# Patient Record
Sex: Female | Born: 1965 | Hispanic: Refuse to answer | Marital: Married
Health system: Southern US, Community
[De-identification: ages and names within clinical notes are randomized; demographics above are authoritative.]

## PROBLEM LIST (undated history)

## (undated) DIAGNOSIS — A630 Anogenital (venereal) warts: Secondary | ICD-10-CM

## (undated) DIAGNOSIS — I10 Essential (primary) hypertension: Secondary | ICD-10-CM

## (undated) DIAGNOSIS — R51 Headache: Secondary | ICD-10-CM

## (undated) DIAGNOSIS — Z8679 Personal history of other diseases of the circulatory system: Secondary | ICD-10-CM

## (undated) DIAGNOSIS — Z9189 Other specified personal risk factors, not elsewhere classified: Secondary | ICD-10-CM

## (undated) DIAGNOSIS — F329 Major depressive disorder, single episode, unspecified: Secondary | ICD-10-CM

## (undated) DIAGNOSIS — Z87448 Personal history of other diseases of urinary system: Secondary | ICD-10-CM

## (undated) HISTORY — PX: AUGMENTATION MAMMAPLASTY: SUR837

## (undated) HISTORY — DX: Personal history of other diseases of the circulatory system: Z86.79

## (undated) HISTORY — DX: Essential (primary) hypertension: I10

## (undated) HISTORY — DX: Anogenital (venereal) warts: A63.0

## (undated) HISTORY — PX: OTHER SURGICAL HISTORY: SHX169

## (undated) HISTORY — DX: Major depressive disorder, single episode, unspecified: F32.9

## (undated) HISTORY — DX: Other specified personal risk factors, not elsewhere classified: Z91.89

## (undated) HISTORY — DX: Headache: R51

## (undated) HISTORY — DX: Personal history of other diseases of urinary system: Z87.448

---

## 1999-09-14 ENCOUNTER — Other Ambulatory Visit: Admission: RE | Admit: 1999-09-14 | Discharge: 1999-09-14 | Payer: Self-pay | Admitting: Internal Medicine

## 1999-09-16 ENCOUNTER — Encounter: Admission: RE | Admit: 1999-09-16 | Discharge: 1999-12-15 | Payer: Self-pay | Admitting: Gynecology

## 2000-03-02 ENCOUNTER — Encounter: Payer: Self-pay | Admitting: Gynecology

## 2000-03-02 ENCOUNTER — Observation Stay (HOSPITAL_COMMUNITY): Admission: AD | Admit: 2000-03-02 | Discharge: 2000-03-03 | Payer: Self-pay | Admitting: Gynecology

## 2000-03-04 ENCOUNTER — Inpatient Hospital Stay (HOSPITAL_COMMUNITY): Admission: AD | Admit: 2000-03-04 | Discharge: 2000-03-06 | Payer: Self-pay | Admitting: Gynecology

## 2000-03-08 ENCOUNTER — Inpatient Hospital Stay (HOSPITAL_COMMUNITY): Admission: AD | Admit: 2000-03-08 | Discharge: 2000-03-12 | Payer: Self-pay | Admitting: Gynecology

## 2000-03-10 ENCOUNTER — Encounter: Payer: Self-pay | Admitting: Gynecology

## 2000-03-27 ENCOUNTER — Inpatient Hospital Stay (HOSPITAL_COMMUNITY): Admission: AD | Admit: 2000-03-27 | Discharge: 2000-03-27 | Payer: Self-pay | Admitting: Gynecology

## 2000-03-29 ENCOUNTER — Inpatient Hospital Stay (HOSPITAL_COMMUNITY): Admission: AD | Admit: 2000-03-29 | Discharge: 2000-03-29 | Payer: Self-pay | Admitting: Internal Medicine

## 2000-04-23 ENCOUNTER — Inpatient Hospital Stay (HOSPITAL_COMMUNITY): Admission: AD | Admit: 2000-04-23 | Discharge: 2000-04-25 | Payer: Self-pay | Admitting: *Deleted

## 2000-04-26 ENCOUNTER — Encounter: Admission: RE | Admit: 2000-04-26 | Discharge: 2000-07-19 | Payer: Self-pay | Admitting: *Deleted

## 2001-06-15 ENCOUNTER — Other Ambulatory Visit: Admission: RE | Admit: 2001-06-15 | Discharge: 2001-06-15 | Payer: Self-pay | Admitting: Gynecology

## 2003-07-23 ENCOUNTER — Other Ambulatory Visit: Admission: RE | Admit: 2003-07-23 | Discharge: 2003-07-23 | Payer: Self-pay | Admitting: Gynecology

## 2004-07-01 ENCOUNTER — Inpatient Hospital Stay: Admission: AD | Admit: 2004-07-01 | Discharge: 2004-07-01 | Payer: Self-pay | Admitting: Obstetrics and Gynecology

## 2004-07-25 ENCOUNTER — Inpatient Hospital Stay (HOSPITAL_COMMUNITY): Admission: AD | Admit: 2004-07-25 | Discharge: 2004-07-25 | Payer: Self-pay | Admitting: Gynecology

## 2004-08-01 ENCOUNTER — Inpatient Hospital Stay (HOSPITAL_COMMUNITY): Admission: AD | Admit: 2004-08-01 | Discharge: 2004-08-01 | Payer: Self-pay | Admitting: Gynecology

## 2004-08-11 ENCOUNTER — Encounter (INDEPENDENT_AMBULATORY_CARE_PROVIDER_SITE_OTHER): Payer: Self-pay | Admitting: Specialist

## 2004-08-11 ENCOUNTER — Ambulatory Visit (HOSPITAL_COMMUNITY): Admission: RE | Admit: 2004-08-11 | Discharge: 2004-08-11 | Payer: Self-pay | Admitting: Gynecology

## 2004-08-11 ENCOUNTER — Ambulatory Visit (HOSPITAL_BASED_OUTPATIENT_CLINIC_OR_DEPARTMENT_OTHER): Admission: RE | Admit: 2004-08-11 | Discharge: 2004-08-11 | Payer: Self-pay | Admitting: Gynecology

## 2004-09-23 ENCOUNTER — Ambulatory Visit: Payer: Self-pay | Admitting: Internal Medicine

## 2004-11-25 ENCOUNTER — Other Ambulatory Visit: Admission: RE | Admit: 2004-11-25 | Discharge: 2004-11-25 | Payer: Self-pay | Admitting: Obstetrics and Gynecology

## 2005-02-14 ENCOUNTER — Inpatient Hospital Stay (HOSPITAL_COMMUNITY): Admission: AD | Admit: 2005-02-14 | Discharge: 2005-02-14 | Payer: Self-pay | Admitting: Obstetrics and Gynecology

## 2005-07-24 ENCOUNTER — Inpatient Hospital Stay (HOSPITAL_COMMUNITY): Admission: AD | Admit: 2005-07-24 | Discharge: 2005-07-27 | Payer: Self-pay | Admitting: Obstetrics & Gynecology

## 2005-08-13 ENCOUNTER — Encounter: Admission: RE | Admit: 2005-08-13 | Discharge: 2005-09-12 | Payer: Self-pay | Admitting: Obstetrics & Gynecology

## 2005-09-13 ENCOUNTER — Encounter: Admission: RE | Admit: 2005-09-13 | Discharge: 2005-10-13 | Payer: Self-pay | Admitting: Obstetrics & Gynecology

## 2005-10-14 ENCOUNTER — Encounter: Admission: RE | Admit: 2005-10-14 | Discharge: 2005-11-10 | Payer: Self-pay | Admitting: Obstetrics & Gynecology

## 2005-11-11 ENCOUNTER — Encounter: Admission: RE | Admit: 2005-11-11 | Discharge: 2005-12-11 | Payer: Self-pay | Admitting: Obstetrics & Gynecology

## 2005-12-12 ENCOUNTER — Encounter: Admission: RE | Admit: 2005-12-12 | Discharge: 2006-01-03 | Payer: Self-pay | Admitting: Obstetrics & Gynecology

## 2009-06-06 LAB — CONVERTED CEMR LAB

## 2009-06-06 LAB — HM MAMMOGRAPHY

## 2010-06-02 ENCOUNTER — Ambulatory Visit: Payer: Self-pay | Admitting: Internal Medicine

## 2010-06-02 DIAGNOSIS — R51 Headache: Secondary | ICD-10-CM

## 2010-06-02 DIAGNOSIS — Z8679 Personal history of other diseases of the circulatory system: Secondary | ICD-10-CM | POA: Insufficient documentation

## 2010-06-02 DIAGNOSIS — Z87448 Personal history of other diseases of urinary system: Secondary | ICD-10-CM | POA: Insufficient documentation

## 2010-06-02 DIAGNOSIS — F3289 Other specified depressive episodes: Secondary | ICD-10-CM

## 2010-06-02 DIAGNOSIS — I10 Essential (primary) hypertension: Secondary | ICD-10-CM

## 2010-06-02 DIAGNOSIS — A630 Anogenital (venereal) warts: Secondary | ICD-10-CM | POA: Insufficient documentation

## 2010-06-02 DIAGNOSIS — Z9189 Other specified personal risk factors, not elsewhere classified: Secondary | ICD-10-CM | POA: Insufficient documentation

## 2010-06-02 DIAGNOSIS — F329 Major depressive disorder, single episode, unspecified: Secondary | ICD-10-CM

## 2010-06-02 DIAGNOSIS — R519 Headache, unspecified: Secondary | ICD-10-CM | POA: Insufficient documentation

## 2010-06-02 HISTORY — DX: Anogenital (venereal) warts: A63.0

## 2010-06-02 HISTORY — DX: Personal history of other diseases of the circulatory system: Z86.79

## 2010-06-02 HISTORY — DX: Personal history of other diseases of urinary system: Z87.448

## 2010-06-02 HISTORY — DX: Headache: R51

## 2010-06-02 HISTORY — DX: Major depressive disorder, single episode, unspecified: F32.9

## 2010-06-02 HISTORY — DX: Other specified personal risk factors, not elsewhere classified: Z91.89

## 2010-06-02 HISTORY — DX: Essential (primary) hypertension: I10

## 2010-06-02 HISTORY — DX: Other specified depressive episodes: F32.89

## 2010-06-03 LAB — CONVERTED CEMR LAB
AST: 16 units/L (ref 0–37)
Albumin: 4.5 g/dL (ref 3.5–5.2)
Alkaline Phosphatase: 68 units/L (ref 39–117)
BUN: 28 mg/dL — ABNORMAL HIGH (ref 6–23)
Basophils Absolute: 0.1 10*3/uL (ref 0.0–0.1)
Basophils Relative: 1.1 % (ref 0.0–3.0)
Bilirubin, Direct: 0.1 mg/dL (ref 0.0–0.3)
Chloride: 105 meq/L (ref 96–112)
Creatinine, Ser: 0.9 mg/dL (ref 0.4–1.2)
Glucose, Bld: 92 mg/dL (ref 70–99)
Hemoglobin: 14.3 g/dL (ref 12.0–15.0)
Lymphocytes Relative: 24.2 % (ref 12.0–46.0)
Monocytes Relative: 8.7 % (ref 3.0–12.0)
Neutro Abs: 4.4 10*3/uL (ref 1.4–7.7)
Neutrophils Relative %: 64.8 % (ref 43.0–77.0)
RBC: 4.53 M/uL (ref 3.87–5.11)
Total Protein: 7.4 g/dL (ref 6.0–8.3)
WBC: 6.8 10*3/uL (ref 4.5–10.5)

## 2010-06-19 ENCOUNTER — Encounter: Payer: Self-pay | Admitting: Internal Medicine

## 2010-09-26 ENCOUNTER — Encounter: Payer: Self-pay | Admitting: Gynecology

## 2010-10-06 NOTE — Assessment & Plan Note (Signed)
Summary: new pt/uhc/#/lb   Vital Signs:  Patient profile:   45 year old female Height:      67 inches (170.18 cm) Weight:      137.0 pounds (62.27 kg) BMI:     21.53 O2 Sat:      98 % on Room air Temp:     98.0 degrees F (36.67 degrees C) oral Pulse rate:   102 / minute BP sitting:   110 / 80  (left arm) Cuff size:   large  Vitals Entered By: Orlan Leavens RMA (June 02, 2010 3:04 PM)  O2 Flow:  Room air CC: New patient Is Patient Diabetic? No Pain Assessment Patient in pain? no        Primary Care Provider:  Newt Lukes MD  CC:  New patient.  History of Present Illness: new pt to me and our practice, here to est care  c/o depression  precipitated by ongoing separation and pending divorce onset >7mo ago but getting worse in past 12 weeks +hx same - never on meds but ongoing counseling >12 mo a/w tearfulness and feelings of worthlessness/hopelessness; "no energy to get up and go" insomnia and irritability of mood with coworkers and kids denies SI/HI no weight loss - no abd pain,  weakness or bowel changes  Preventive Screening-Counseling & Management  Alcohol-Tobacco     Alcohol drinks/day: <1     Alcohol Counseling: not indicated; use of alcohol is not excessive or problematic     Smoking Status: never     Tobacco Counseling: not indicated; no tobacco use  Caffeine-Diet-Exercise     Does Patient Exercise: yes     Exercise Counseling: not indicated; exercise is adequate     Depression Counseling: further diagnostic testing and/or other treatment is indicated  Safety-Violence-Falls     Seat Belt Counseling: not indicated; patient wears seat belts     Firearms in the Home: no firearms in the home     Firearm Counseling: not applicable     Violence Counseling: not indicated; no violence risk noted     Fall Risk Counseling: not indicated; no significant falls noted  Clinical Review Panels:  Prevention   Last Mammogram:  No specific mammographic  evidence of malignancy.   (06/06/2009)   Last Pap Smear:  Interpretation Result:Negative for intraepithelial Lesion or Malignancy.    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 I have reviewed all other systems and they were negative.   Physical Exam  General:  alert, well-developed, well-nourished, and cooperative to examination.    Eyes:  vision grossly intact; pupils equal, round and reactive to light.  conjunctiva and lids normal.    Neck:  supple, full ROM, no masses, no thyromegaly; no thyroid nodules or tenderness. no JVD or carotid bruits.   Lungs:  normal respiratory effort, no intercostal retractions or use of accessory  muscles; normal breath sounds bilaterally - no crackles and no wheezes.    Heart:  normal rate, regular rhythm, no murmur, and no rub. BLE without edema Genitalia:  defer to gyn Neurologic:  alert & oriented X3 and cranial nerves II-XII symetrically intact.  strength normal in all extremities, sensation intact to light touch, and gait normal. speech fluent without dysarthria or aphasia; follows commands with good comprehension.  Skin:  no rashes, vesicles, ulcers, or erythema. No nodules or irregularity to palpation.  Psych:  Oriented X3, memory intact for recent and remote, normally interactive,  good eye contact, not anxious appearing, mod depressed appearing, and not agitated.      Impression & Recommendations:  Problem # 1:  DEPRESSION (ICD-311) check meds to exclude underlying med dz - thryoid, anemia, etc hx same, exac by divorce process - in counseling >38mo w/o resolution of symptoms - has been advised by counselor and friends to seek med help - discussed potential benefits and risks of med tx with SSRI - pt understands and agrees to same titrate citalopram slow and call if adv SE - recheck 4-6 weeks to review symptoms and meds - titrate or change as needed  to continue weekly therapy/counseling as ongoing Her updated medication list for this problem includes:    Citalopram Hydrobromide 20 Mg Tabs (Citalopram hydrobromide) .Marland Kitchen... 1/2 by mouth once daily x 6 days, then increase to 1 by mouth once daily (or as directed)  Orders: TLB-CBC Platelet - w/Differential (85025-CBCD) TLB-Hepatic/Liver Function Pnl (80076-HEPATIC) TLB-TSH (Thyroid Stimulating Hormone) (84443-TSH) TLB-BMP (Basic Metabolic Panel-BMET) (80048-METABOL) Prescription Created Electronically [phone])  Complete Medication List: 1)  Citalopram Hydrobromide 20 Mg Tabs (Citalopram hydrobromide) .... 1/2 by mouth once daily x 6 days, then increase to 1 by mouth once daily (or as directed)  Patient Instructions: 1)  it was good to see you today. 2)  test(s) ordered today - your results will be called to you after review in 24-48 hours from the time of test completion 3)  if labs normal, start citalopram for depression as discussed - your prescription has been electronically submitted to your pharmacy. Please take as directed. Contact our office if you believe you're having problems with the medication(s).  4)  continue counseling as ongoing - they may contact us if/as needed  5)  Please schedule a follow-up appointment in 4-6 weeks to review symptoms and medications, call sooner if problems.   Prescriptions: CITALOPRAM HYDROBROMIDE 20 MG TABS (CITALOPRAM HYDROBROMIDE) 1/2 by mouth once daily x 6 days, then increase to 1 by mouth once daily (or as directed)  #30 x 2   Entered and Authorized by:   Newt Lukes MD   Signed by:   Newt Lukes MD on 06/02/2010   Method used:   Electronically to        [facility] [address] [phone]* (retail)       [address]       [place], [place]  [id number]       Ph: [phone] or [phone]       Fax: [phone]   RxID:   [id number]    Pap Smear  Procedure date:  06/06/2009  Findings:      Interpretation Result:Negative for intraepithelial Lesion or Malignancy.     Mammogram  Procedure date:  06/06/2009  Findings:      No specific mammographic evidence of malignancy.

## 2010-10-06 NOTE — Letter (Signed)
Summary: [facility]   Imported By: Sherian Rein 06/29/2010 13:27:01  _____________________________________________________________________  External Attachment:    Type:   Image     Comment:   External Document

## 2011-01-22 NOTE — H&P (Signed)
NAMEKirke [name]             ACCOUNT NO.:  [id number]   MEDICAL RECORD NO.:  [id number]          PATIENT TYPE:   LOCATION:                                 FACILITY:   PHYSICIAN:  Juan H. Lily Peer, M.D.     DATE OF BIRTH:   DATE OF ADMISSION:  08/11/2004  DATE OF DISCHARGE:                                HISTORY & PHYSICAL   CHIEF COMPLAINT:  First trimester missed AB.   HISTORY:  The patient is a 45 year old, gravida 2, para 1, who was seen in  consultation in the emergency room at [facility] on August 01, 2004.  The patient was being evaluated for threatened abortion.  She was 6 weeks  into her pregnancy, and she had been seen the week prior to that, whereby  her quantitative beta hCGs had been monitored.  The ultrasound that was done  at [facility] on August 01, 2004 had shown a fundal uterine sac  consistent with 5 weeks, but no fetal pole was noted.  Threatened abortion  precautions were provided.  Review of her records indicated that her  quantitative beta hCGs had been increasing, but not complete doubling time.  She had had a quantitative beta hCG of 225 on July 25, 2004.  Follow up  on July 28, 2004 was 432.6, and on August 01, 2004 it was 712, and on  August 06, 2004, it was only 729.8 mIU/mL.  The patient denied any bleeding  or cramping at that time, and today she was complaining of some brownish  discharge.  The ultrasound that was done on August 17, 2004 demonstrated a  uterus that measured 7.2 x 3.5 cm, small fluid area within the endometrial  cavity such as a small gestational sac consistent with 4.9 weeks,  questionable small yolk sac, no fetal pole noted.  No apparent adnexal  masses.  She was asked to watch the symptoms over the next few days, in the  event that she was to bleed spontaneously and completely abort.  She came to  the office today, and there was no apparent right or left adnexal mass.  I  was unable to identify a  previous fluid-filled area with a yolk sac.  Both  ovaries were otherwise normal.  It appears the patient has had a missed AB,  and since she is still having some bleeding, she was to undergo a D&E at  [facility] tomorrow.  Review of her records had also  indicated that she is Rh positive.  The patient this pregnancy had conceived  on Clomid and IUI.  She had also been on progesterone suppository, as well,  for luteal support.   PAST MEDICAL HISTORY:  The patient has had one prior pregnancy delivered  vaginally in 2001.  She did have preterm labor with her first pregnancy, but  delivered at term.   ALLERGIES:  She denies any allergies.   SOCIAL HISTORY:  Occasional social alcohol, but none on a regular basis.   FAMILY HISTORY:  Significant for cardiovascular disease.  Her father had  coronary bypass and hypertension.  PHYSICAL EXAMINATION:  GENERAL:  Well-developed, well-nourished female.  HEENT:  Unremarkable.  NECK:  Supple.  Trachea midline.  No carotid bruits.  No thyromegaly.  LUNGS:  Clear to auscultation without rhonchi or wheezes.  HEART:  Regular rate and rhythm.  No murmurs or gallops.  BREASTS:  Not done.  ABDOMEN:  Soft, nontender, without rebound or guarding.  PELVIC:  Bartholin's, urethra, Skene's glands within normal limits.  Vagina  and cervix with no gross lesions on inspection.  Brownish bloody discharge,  but no active bleeding.  Uterus 8-10 weeks size, anteverted.   ASSESSMENT:  A 45 year old, gravida 2, para 1, with apparent first trimester  missed abortion.  Will be taken to the operating room in [facility] for dilatation and evacuation of the intrauterine cavity.  Will wait  to see for the results pathologically, and then follow also with  quantitative beta hCG.  Her Rh factor is positive.  The risks, benefits,  pros, and cons of the operation were discussed with the patient, and will  follow accordingly.   PLAN:  As per  assessment above.     Juan   JHF/MEDQ  D:  08/10/2004  T:  08/10/2004  Job:  [id number]
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
## 2011-01-22 NOTE — H&P (Signed)
[facility]  Patient:    Linda [name], Linda [name]                    MRN: [id number] Adm. Date:  [id number] Disc. Date: [id number] Attending:  Merrily Pew                         History and Physical  CHIEF COMPLAINT:              Preterm labor.  HISTORY OF PRESENT ILLNESS:   A 45 year old, gravida 1, para 0, female at [redacted] weeks gestation, history of preterm labor treated initially with Terbutaline oral which she broke through and subsequently was treated with IV magnesium sulfate.  The patient was subsequently started on Nifedipine and was discharged from the hospital two days ago and noted since last evening increasing contractions.  External monitoring today revealed regular uterine contractions every five to six minutes and she is admitted at this time for IV tocolysis and consideration for the Terbutaline pump.  The patients Beta Strep culture was positive on prior admission.  Recent urinalysis with culture is negative.  GC and Chlamydia screen negative.  For the remainder of her history, please see her Hollister form.  PHYSICAL EXAMINATION:  VITAL SIGNS:                  Afebrile, vital signs are stable.  HEENT:                        Normal.  LUNGS:                        Clear.  HEART:                        Regular rate and rhythm with 1 to 2/6 systolic ejection murmur.  No rubs or gallops.  ABDOMEN:                      Benign with gravid vertex fetus appropriate for dates. Positive fetal heart tones.  PELVIC:                       Cervix is 50% effaced and closed with ballooning f the lower uterine segment.  ASSESSMENT:                   A 45 year old, gravida 1, para 0, female at [redacted] weeks gestation, recent admissions for preterm labor initially with oral Terbutaline,  subsequent IV magnesium, subsequent oral Nifedipine, now with breakthrough contractions and cervix changing, was long and now 50% effaced with  ballooning f the lower uterine segment.  The cervix still remains closed.  She is Strep positive on culture and will go ahead and cover her for this with penicillin and make arrangements for the Terbutaline pump.  Will hold on steroids at present given er cervix is closed and if able to stop easily with magnesium, then will subsequently switch to the pump.  If requires retreatment with magnesium with pump failure and/or more difficult to stop with magnesium then will treat with steroids at this time. DD:  03/08/00 TD:  03/08/00 Job: [id number]
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
## 2011-01-22 NOTE — Op Note (Signed)
NAMEJIMI, Linda [name]             ACCOUNT NO.:  [id number]   MEDICAL RECORD NO.:  [id number]          PATIENT TYPE:  AMB   LOCATION:  [facility]                         FACILITY:  [facility]   PHYSICIAN:  Juan H. Lily Peer, M.D.DATE OF BIRTH:  16-Apr-1966   DATE OF PROCEDURE:  08/11/2004  DATE OF DISCHARGE:                                 OPERATIVE REPORT   INDICATION FOR OPERATION:  A 45 year old, gravida 2, para 1, now AB 1with  missed AB.   PREOPERATIVE DIAGNOSIS:  First trimester missed AB.   POSTOPERATIVE DIAGNOSIS:  First trimester missed AB.   ANESTHESIA:  MAC along with a paracervical block, consisting of 2% Xylocaine  with 1:100,000 epinephrine.   SURGEON:  Juan H. Lily Peer, M.D.   PROCEDURE PERFORMED:  Dilatation and evacuation.   DESCRIPTION OF OPERATION:  After the patient was adequately counseled, she  was taken to the operating room where she had received 1 g of Cefotan for  prophylaxis.  She had just voided before coming into the operating room, so  she did not have to be catheterized, and she received intravenous sedation.  A Graves speculum was inserted into the vaginal vault after the vagina and  perineum had been prepped and draped in the usual sterile fashion.  Xylocaine 2% with 1:100,000 epinephrine was infiltrated into the cervical  stroma at the 2, 4, 8, and 10 o'clock position for a total of 10 mL.  Of  note, the bimanual examination demonstrated the uterus to be anteverted,  upper limits of normal with no palpable adnexal masses.  The uterus was  sounded to approximately 7 cm, and the cervix was serially dilated with a  Pratt dilator to a size 23.  A 7 mm suction curette was introduced into the  intrauterine cavity to remove the products of conception, and this was  interchanged with a serrated curette to completely evacuate the cavity of  its contents.  Minimal tissue was obtained.  It was submitted for  histological evaluation.  The single-tooth tenaculum was  removed.  There was  good hemostasis present.  The patient was transferred to the recovery room  with stable vital signs.  Blood loss was minimal.  Fluid resuscitation  consisted of 500 mL of lactated Ringer's.  The patient's blood type is B  positive.     Juan   JHF/MEDQ  D:  08/11/2004  T:  08/11/2004  Job:  [id number]

## 2011-01-22 NOTE — Consult Note (Signed)
NAMEJANELY, [name]             ACCOUNT NO.:  [id number]   MEDICAL RECORD NO.:  [id number]          PATIENT TYPE:  MAT   LOCATION:  [facility]                          FACILITY:  [facility]   PHYSICIAN:  Juan H. Lily Peer, M.D.DATE OF BIRTH:  1966/06/28   DATE OF CONSULTATION:  DATE OF DISCHARGE:                                   CONSULTATION   HISTORY OF PRESENT ILLNESS:  The patient is a 45 year old gravida 2, para 1,  whose last menstrual period was August 01, 2004, estimated date of  confinement June 20, 2004.  Currently, functionally, 6 week intrauterine  pregnancy.  The patient conceived this patient through ovulation induction  medication consisting of homophene citrate and intrauterine insemination.  The patient had been seen in the emergency room here at [facility] last week  with some spotting and had been followed as an outpatient with quantitative  beta HCG.  The patient denies any history of any pelvic infections or any  previous pelvic surgery.  Her child had been delivered vaginally.  She had  had quantitative beta HCG on November 19 with a value of 225 mille IU/ml.  On November 24, it was repeated and the value increased to 430 mille IU/ml,  and this morning it was repeated and had risen only 712 mille IU/ml.  The  patient complained of spotting.  Her hemoglobin/hematocrit were 14 and 41.4,  respectively with a normal platelet count.  Her blood type was B positive.  The ultrasound demonstrated at fundal gestational sack consisting with five  weeks.  No fetal pulsing yet, and no abnormality on the adnexa with the  exception of a small corpus luteum cyst.  Her cervical lymph measurement was  2.9 cm, and there was no blood on the vaginal probe.  The patient's abdomen  was soft and nontender, and her vital signs were as follows.   PHYSICAL EXAMINATION:  VITAL SIGNS:  Blood pressure 115/99, respirations 16,  pulse 98, temperature 98.8.  I explained to Linda [name] that the typical  normal doubling time of quantitative beta HCG is not being seen at the  present time, but there was a small hope due to the fact that a gestational  sack of normal configuration was seen, and although her quantitative is  increasing, we will have her return to the office in the middle of next week  for a quantitative beta HCG and an ultrasound.  To follow up with her  primary care physician, Dr. Eda Paschal, for further evaluation in the event  that she may have a missed AB or still small probability whether this could  be an early pregnancy with a slow rise in her quantitative beta HCG.  She  was instructed to refrain from any intercourse or any strenuous activity.  All questions were answered and will follow accordingly.     Juan   JHF/MEDQ  D:  08/01/2004  T:  08/01/2004  Job:  [id number]

## 2011-01-22 NOTE — Discharge Summary (Signed)
[facility]  Patient:    Linda [name], Linda [name]                    MRN: [id number] Adm. Date:  [id number] Disc. Date: [id number] Attending:  Wetzel Bjornstad Dictator:   Antony Contras, [facility]                           Discharge Summary  DISCHARGE DIAGNOSES:          1. Intrauterine pregnancy at 40 weeks.                               2. History of preterm labor, managed with                                  terbutaline pump.                               3. History of positive group B strep.                               4. Spontaneous onset of labor.  PROCEDURES:                   Normal spontaneous vaginal delivery over a second degree laceration.  HISTORY OF PRESENT ILLNESS:   The patient is a 45 year old prima gravida of an LMP of July 17, 1999, [facility] April 22, 2000.  Prenatal course was complicated by preterm labor which was managed with Brethine and a terbutaline pump.  PRENATAL LABORATORY DATA:     Blood type is B positive, antibody screen is positive. RPR, HBsAg nonreactive.  Toxoplasmosis negative.  Rubella immune. MSAFP within normal limits.  HOSPITAL COURSE:              The patient was admitted on April 23, 2000,with spontaneous onset of labor.  Cervix was 5 cm, 100% effaced, 0 station.  Labor did progress to complete dilatation and she delivered an Apgar 9, 66 female infant weighing 3395 g over an intact perineum with a second degree laceration.  Postpartum course was within normal limits.  She remained afebrile.  She was able to be discharged home on her second postpartum day in satisfactory condition.  Postpartum laboratory data:  Hematocrit 33.5, hemoglobin 11.7, platelets 156,000.  DISPOSITION:                  Follow-up in six weeks.  Continue prenatal vitamins and iron.  Tylox or Motrin for pain. DD:  05/18/00 TD:  05/20/00 Job: [id number]

## 2011-01-22 NOTE — Discharge Summary (Signed)
[facility]  Patient:    Linda [name], Linda [name]                    MRN: [id number] Adm. Date:  [id number] Disc. Date: [id number] Attending:  Lenard Lance Dictator:   Antony Contras, [facility]                           Discharge Summary  DISCHARGE DIAGNOSIS:          Intrauterine pregnancy at 33 weeks in preterm labor.  PROCEDURES:                   Tocolysis with magnesium sulfate and initiation of a terbutaline pump.  HISTORY OF PRESENT ILLNESS:   The patient is a 45 year old, primigravida with an LMP of July 17, 1999, [facility] of April 22, 2000.  The pregnancy has been complicated by preterm labor, which was initially treated with oral terbutaline, which she brought through and subsequently was treated with IV magnesium sulfate.  She was subsequently started on nifedipine and discharged from the hospital.  She began complaining of increasing contractions after discharge.  Prenatal laboratories are as follows:  The blood type is B+.  The antibody screen is negative.  Rubella immune.  RPR, HBSAG, and HIV nonreactive.  MSAFP within normal limits.  GBS is positive.  HOSPITAL COURSE:              The patient is readmitted for stabilization.  At this time, her cervix was 50% effaced with ballooning of the lower uterine segment, but her cervix still remained closed.  A recent urine culture and GC and chlamydia screen were negative.  During this admission, she was tocolyzed with magnesium sulfate.  A perinatal consult was obtained with Conni Elliot, M.D., who recommended terbutaline pump.  The patient responded to the therapy with magnesium sulfate and was able to be placed on a terbutaline pump prior to her discharge on March 12, 2000.  DISPOSITION:                  She was able to be discharged in satisfactory condition.  DISCHARGE MEDICATIONS:        1. Continue terbutaline pump.                               2. Amoxicillin 250 mg t.i.d. x 1  week.  FOLLOW-UP:                    She is to be seen in the office in a week. DD:  04/04/00 TD:  04/05/00 Job: [id number] MV/[id number]

## 2011-10-07 ENCOUNTER — Encounter: Payer: Self-pay | Admitting: Internal Medicine

## 2011-10-12 ENCOUNTER — Ambulatory Visit: Payer: Self-pay | Admitting: Internal Medicine

## 2011-10-12 DIAGNOSIS — Z0289 Encounter for other administrative examinations: Secondary | ICD-10-CM

## 2013-05-08 ENCOUNTER — Ambulatory Visit: Payer: Self-pay | Admitting: Psychiatry

## 2013-05-15 ENCOUNTER — Ambulatory Visit (INDEPENDENT_AMBULATORY_CARE_PROVIDER_SITE_OTHER): Payer: BC Managed Care – PPO | Admitting: Psychiatry

## 2013-05-15 DIAGNOSIS — F4323 Adjustment disorder with mixed anxiety and depressed mood: Secondary | ICD-10-CM

## 2013-05-22 ENCOUNTER — Ambulatory Visit (INDEPENDENT_AMBULATORY_CARE_PROVIDER_SITE_OTHER): Payer: BC Managed Care – PPO | Admitting: Psychiatry

## 2013-05-22 DIAGNOSIS — F4323 Adjustment disorder with mixed anxiety and depressed mood: Secondary | ICD-10-CM

## 2013-06-06 ENCOUNTER — Ambulatory Visit: Payer: BC Managed Care – PPO | Admitting: Psychiatry

## 2017-01-12 ENCOUNTER — Other Ambulatory Visit: Payer: Self-pay | Admitting: Internal Medicine

## 2017-01-12 DIAGNOSIS — Z1231 Encounter for screening mammogram for malignant neoplasm of breast: Secondary | ICD-10-CM

## 2017-01-12 DIAGNOSIS — M5416 Radiculopathy, lumbar region: Secondary | ICD-10-CM

## 2017-01-28 ENCOUNTER — Ambulatory Visit
Admission: RE | Admit: 2017-01-28 | Discharge: 2017-01-28 | Disposition: A | Discharge status: Home / Self Care | Payer: BLUE CROSS/BLUE SHIELD | Source: Ambulatory Visit | Attending: Internal Medicine | Admitting: Internal Medicine

## 2017-01-28 DIAGNOSIS — Z1231 Encounter for screening mammogram for malignant neoplasm of breast: Secondary | ICD-10-CM

## 2017-02-03 ENCOUNTER — Other Ambulatory Visit: Payer: Self-pay | Admitting: Internal Medicine

## 2017-02-03 DIAGNOSIS — M5416 Radiculopathy, lumbar region: Secondary | ICD-10-CM

## 2017-02-07 ENCOUNTER — Other Ambulatory Visit: Payer: Self-pay

## 2017-02-07 ENCOUNTER — Ambulatory Visit
Admission: RE | Admit: 2017-02-07 | Discharge: 2017-02-07 | Disposition: A | Discharge status: Home / Self Care | Payer: BLUE CROSS/BLUE SHIELD | Source: Ambulatory Visit | Attending: Internal Medicine | Admitting: Internal Medicine

## 2017-02-07 DIAGNOSIS — M5416 Radiculopathy, lumbar region: Secondary | ICD-10-CM

## 2017-05-25 ENCOUNTER — Encounter: Payer: Self-pay | Admitting: Internal Medicine

## 2017-05-27 ENCOUNTER — Encounter (INDEPENDENT_AMBULATORY_CARE_PROVIDER_SITE_OTHER): Payer: BLUE CROSS/BLUE SHIELD | Admitting: Ophthalmology

## 2017-05-27 DIAGNOSIS — H43813 Vitreous degeneration, bilateral: Secondary | ICD-10-CM | POA: Diagnosis not present

## 2017-05-27 DIAGNOSIS — H35412 Lattice degeneration of retina, left eye: Secondary | ICD-10-CM | POA: Diagnosis not present

## 2017-05-27 DIAGNOSIS — H33302 Unspecified retinal break, left eye: Secondary | ICD-10-CM

## 2017-05-27 DIAGNOSIS — I10 Essential (primary) hypertension: Secondary | ICD-10-CM | POA: Diagnosis not present

## 2017-05-27 DIAGNOSIS — H35372 Puckering of macula, left eye: Secondary | ICD-10-CM | POA: Diagnosis not present

## 2017-05-27 DIAGNOSIS — H35033 Hypertensive retinopathy, bilateral: Secondary | ICD-10-CM

## 2017-05-27 DIAGNOSIS — H2513 Age-related nuclear cataract, bilateral: Secondary | ICD-10-CM

## 2017-06-08 ENCOUNTER — Encounter (INDEPENDENT_AMBULATORY_CARE_PROVIDER_SITE_OTHER): Payer: BLUE CROSS/BLUE SHIELD | Admitting: Ophthalmology

## 2017-06-08 DIAGNOSIS — H35033 Hypertensive retinopathy, bilateral: Secondary | ICD-10-CM | POA: Diagnosis not present

## 2017-06-08 DIAGNOSIS — H33322 Round hole, left eye: Secondary | ICD-10-CM | POA: Diagnosis not present

## 2017-06-08 DIAGNOSIS — I10 Essential (primary) hypertension: Secondary | ICD-10-CM

## 2017-06-20 ENCOUNTER — Encounter (INDEPENDENT_AMBULATORY_CARE_PROVIDER_SITE_OTHER): Payer: BLUE CROSS/BLUE SHIELD | Admitting: Ophthalmology

## 2017-06-20 DIAGNOSIS — H33302 Unspecified retinal break, left eye: Secondary | ICD-10-CM

## 2017-10-21 ENCOUNTER — Encounter (INDEPENDENT_AMBULATORY_CARE_PROVIDER_SITE_OTHER): Payer: BLUE CROSS/BLUE SHIELD | Admitting: Ophthalmology

## 2017-12-14 DIAGNOSIS — J383 Other diseases of vocal cords: Secondary | ICD-10-CM | POA: Insufficient documentation

## 2018-01-16 ENCOUNTER — Other Ambulatory Visit: Payer: Self-pay | Admitting: Neurosurgery

## 2018-01-16 DIAGNOSIS — M4316 Spondylolisthesis, lumbar region: Secondary | ICD-10-CM

## 2018-01-20 ENCOUNTER — Encounter (INDEPENDENT_AMBULATORY_CARE_PROVIDER_SITE_OTHER): Payer: BLUE CROSS/BLUE SHIELD | Admitting: Ophthalmology

## 2018-01-20 DIAGNOSIS — H43813 Vitreous degeneration, bilateral: Secondary | ICD-10-CM | POA: Diagnosis not present

## 2018-01-20 DIAGNOSIS — H33302 Unspecified retinal break, left eye: Secondary | ICD-10-CM

## 2018-01-20 DIAGNOSIS — H35341 Macular cyst, hole, or pseudohole, right eye: Secondary | ICD-10-CM

## 2018-01-20 DIAGNOSIS — H35033 Hypertensive retinopathy, bilateral: Secondary | ICD-10-CM

## 2018-01-20 DIAGNOSIS — H2513 Age-related nuclear cataract, bilateral: Secondary | ICD-10-CM

## 2018-01-20 DIAGNOSIS — I10 Essential (primary) hypertension: Secondary | ICD-10-CM | POA: Diagnosis not present

## 2018-05-03 ENCOUNTER — Other Ambulatory Visit: Payer: Self-pay

## 2018-10-03 IMAGING — MR MR LUMBAR SPINE W/O CM
4 of 5 series · 25 of 48 positions shown · non-contrast
Comparison: None.

CLINICAL DATA: Low back pain extending to the left leg over the
last year.

EXAM:
MRI LUMBAR SPINE WITHOUT CONTRAST
TECHNIQUE: Multiplanar, multisequence MR imaging of the lumbar spine was
performed. No intravenous contrast was administered.

[Series 3: T2 · sagittal · 4.0mm · 0.49mm/px · 6 of 12 slices shown (1 of 2)]
[im 1/12]
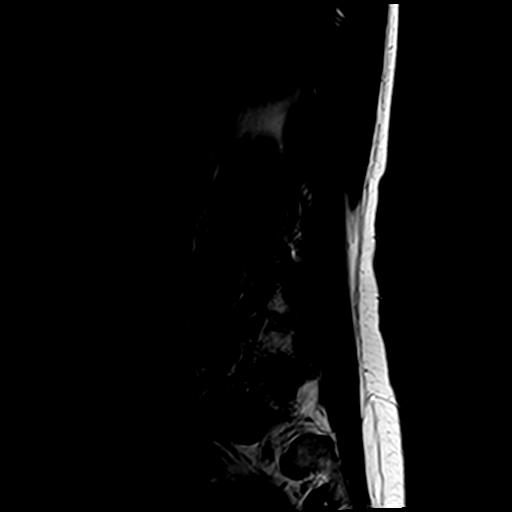
[im 3/12]
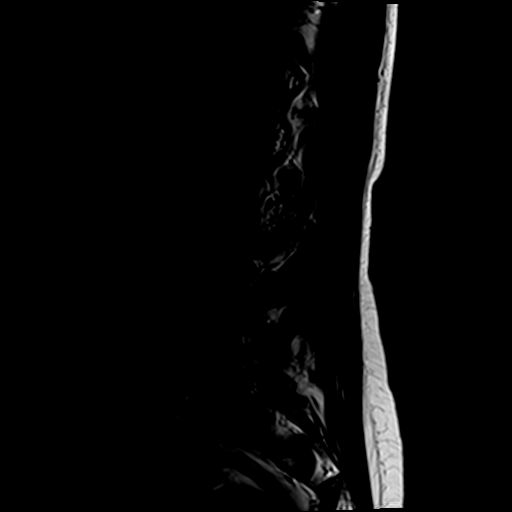
[im 5/12]
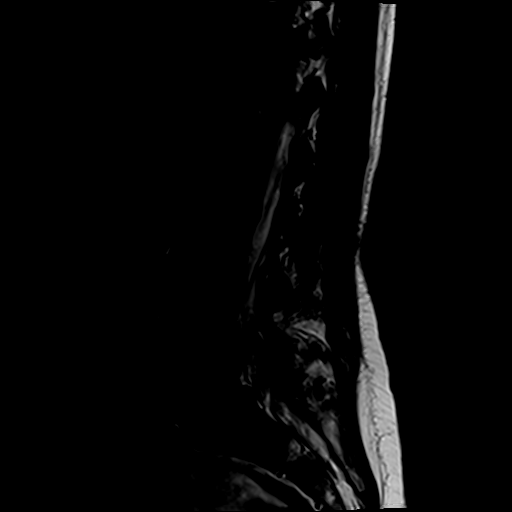
[im 7/12]
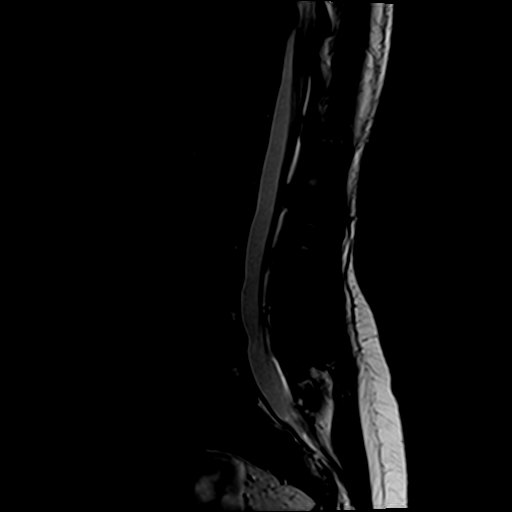
[im 9/12]
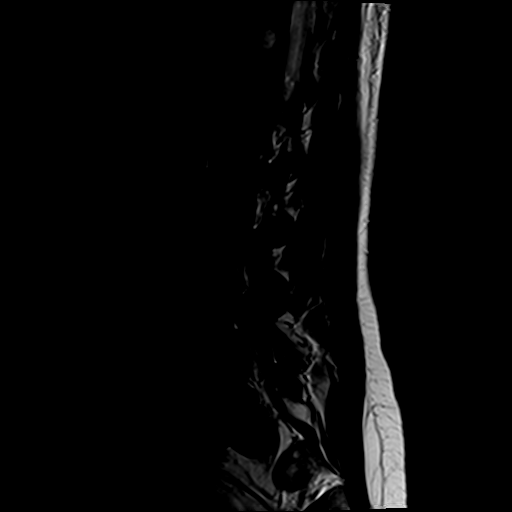
[im 12/12]
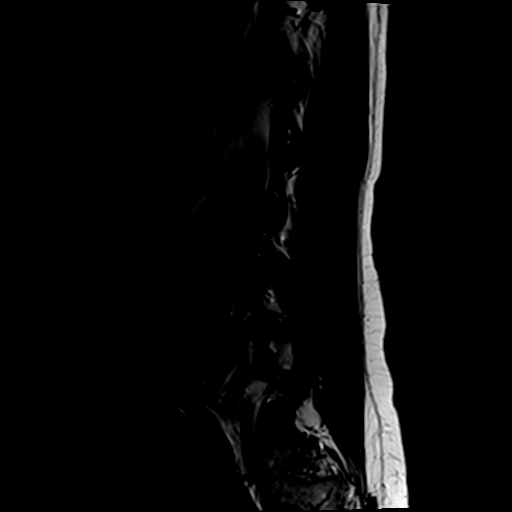

[Series 4: T1 · sagittal · 4.0mm · 0.49mm/px · 6 of 12 slices shown (1 of 2)]
[im 1/12]
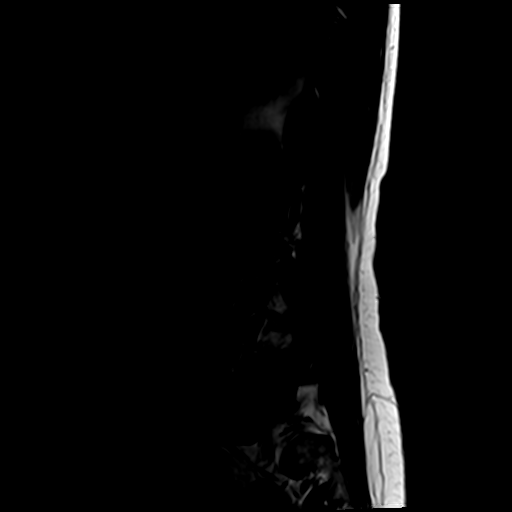
[im 3/12]
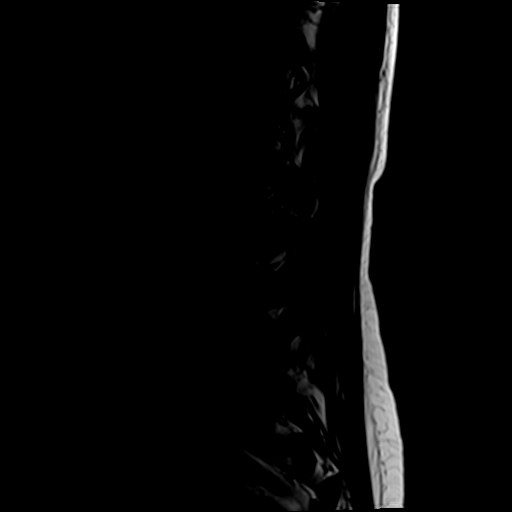
[im 5/12]
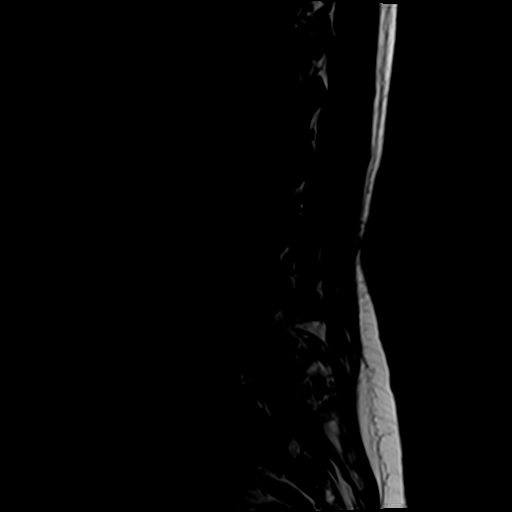
[im 7/12]
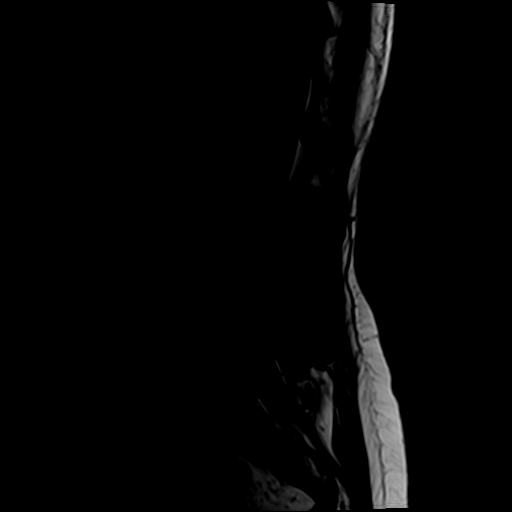
[im 9/12]
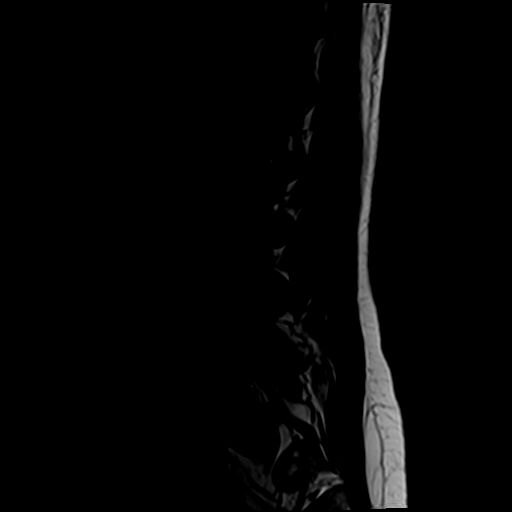
[im 12/12]
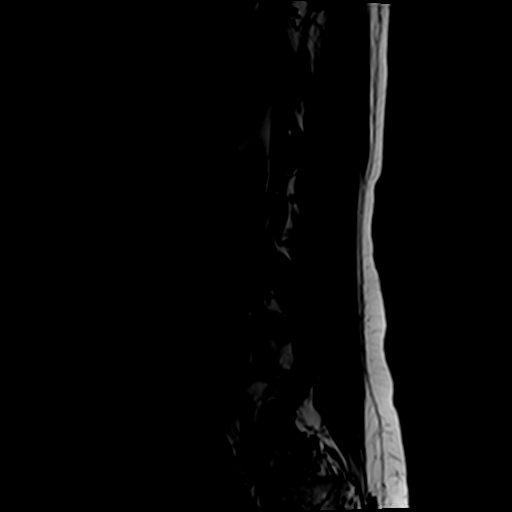

[Series 5: T1 · axial · 4.0mm · 0.37mm/px · z∈[-59,+119]mm · 4 of 32 slices shown (2 of 2)]
[im 1/32]
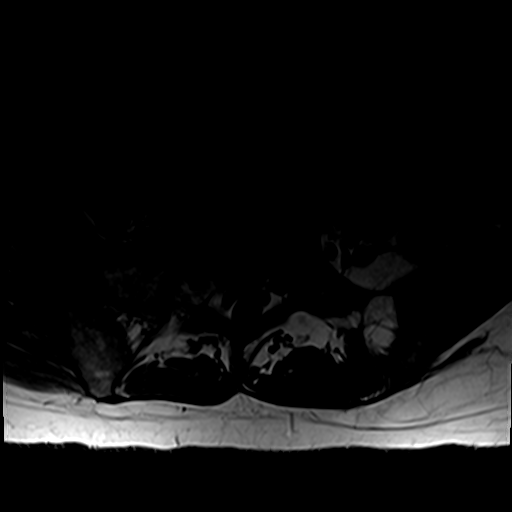
[im 5/32]
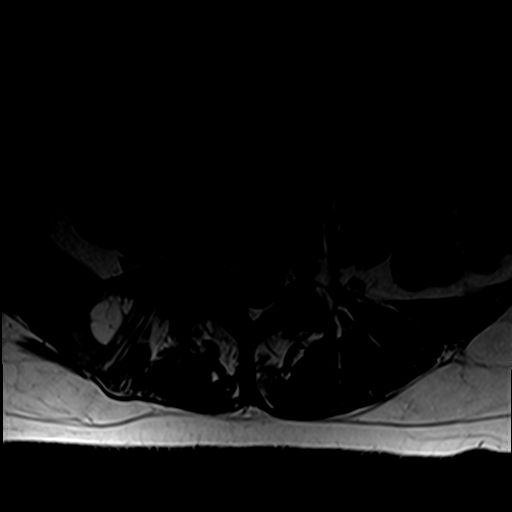
[im 16/32]
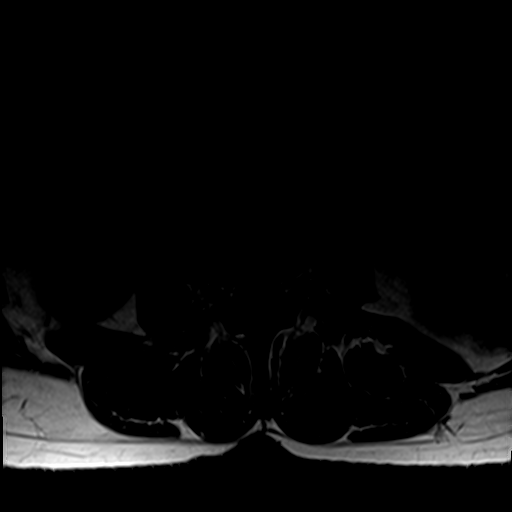
[im 27/32]
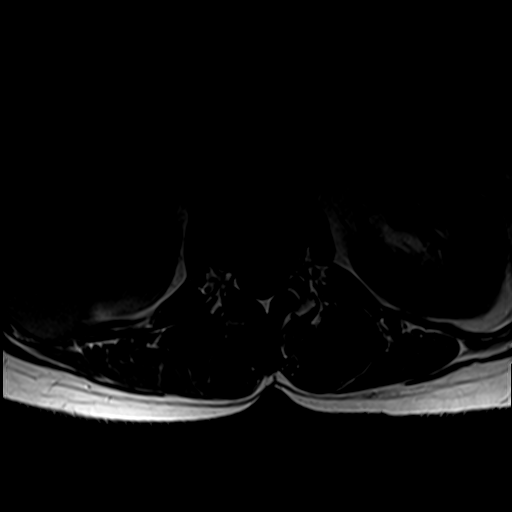

[Series 7: T2 · axial · 4.0mm · 0.74mm/px · z∈[-59,+184]mm · 9 of 32 slices shown (2 of 2)]
[im 1/32]
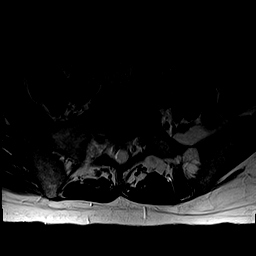
[im 5/32]
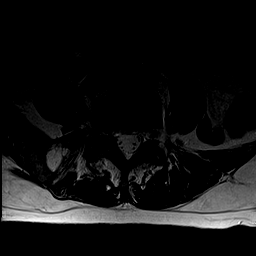
[im 9/32]
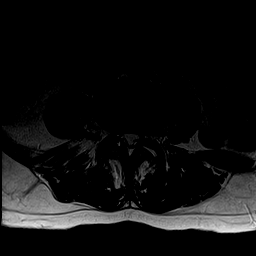
[im 14/32]
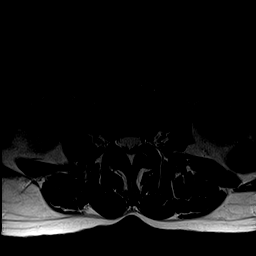
[im 16/32]
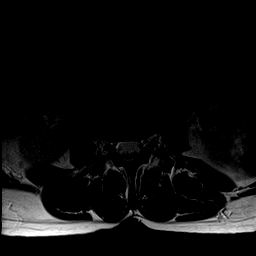
[im 18/32]
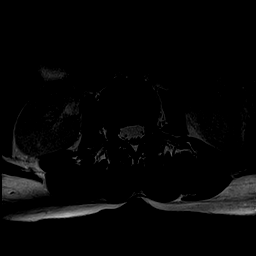
[im 23/32]
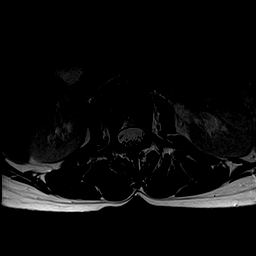
[im 27/32]
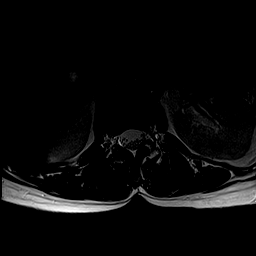
[im 32/32]
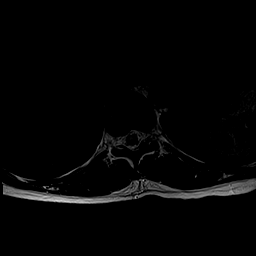

[25 of 48 positions shown; findings below may reference images not displayed]

FINDINGS: Segmentation:  5 lumbar type vertebral bodies.

Alignment:  Normal

Vertebrae: Small hemangioma is at T12, L1 and L2. Discogenic marrow
edema at L4-5.

Conus medullaris: Extends to the L1 level and appears normal.

Paraspinal and other soft tissues: Negative

Disc levels:

No abnormality at L2-3 or above.

L3-4: Desiccation and mild bulging of the disc. No stenosis or
neural compression.

L4-5: Advanced disc degeneration with loss of disc height and
circumferential bulging of the disc. Bilateral facet degeneration
and hypertrophy with joint effusions. Mild stenosis of both lateral
recesses and neural foramina. Discogenic edema within the L4 and L5
vertebral bodies quite likely associated with low back pain.

L5-S1: Disc degeneration with bulging, focally prominent in the
right foraminal to extraforaminal region. Bilateral facet
arthropathy. Pronounced facet edema on the right findings could be
associated with right low back pain, right L5 nerve compression or
referred facet syndrome pain.
IMPRESSION: L4-5: Advanced disc degeneration with circumferential bulging of the
disc. Facet arthropathy with facet and ligamentous hypertrophy. Mild
stenosis of both lateral recesses and neural foramina but without
definite neural compression. This appearance could worsen with
standing or flexion. Pronounced discogenic edema within the L4 and
L5 vertebral bodies could be associated with back pain.

L5-S1: Disc bulge, focally prominent in the right foraminal to
extraforaminal region. Bilateral facet arthropathy right worse than
left with edema on the right. Findings could be associated with
right-sided low back pain, referred facet syndrome pain or right L5
nerve compression.

## 2019-01-31 ENCOUNTER — Encounter (INDEPENDENT_AMBULATORY_CARE_PROVIDER_SITE_OTHER): Payer: BLUE CROSS/BLUE SHIELD | Admitting: Ophthalmology

## 2019-02-12 DIAGNOSIS — Z0000 Encounter for general adult medical examination without abnormal findings: Secondary | ICD-10-CM | POA: Diagnosis not present

## 2019-02-12 DIAGNOSIS — R490 Dysphonia: Secondary | ICD-10-CM | POA: Diagnosis not present

## 2019-02-12 DIAGNOSIS — R002 Palpitations: Secondary | ICD-10-CM | POA: Diagnosis not present

## 2019-02-12 DIAGNOSIS — Z13220 Encounter for screening for lipoid disorders: Secondary | ICD-10-CM | POA: Diagnosis not present

## 2019-02-12 DIAGNOSIS — M5416 Radiculopathy, lumbar region: Secondary | ICD-10-CM | POA: Diagnosis not present

## 2019-02-12 DIAGNOSIS — I700 Atherosclerosis of aorta: Secondary | ICD-10-CM | POA: Diagnosis not present

## 2019-02-12 DIAGNOSIS — F439 Reaction to severe stress, unspecified: Secondary | ICD-10-CM | POA: Diagnosis not present

## 2019-02-12 DIAGNOSIS — Z23 Encounter for immunization: Secondary | ICD-10-CM | POA: Diagnosis not present

## 2019-04-13 DIAGNOSIS — H8110 Benign paroxysmal vertigo, unspecified ear: Secondary | ICD-10-CM | POA: Diagnosis not present

## 2019-09-16 DIAGNOSIS — Z03818 Encounter for observation for suspected exposure to other biological agents ruled out: Secondary | ICD-10-CM | POA: Diagnosis not present

## 2019-09-16 DIAGNOSIS — Z20828 Contact with and (suspected) exposure to other viral communicable diseases: Secondary | ICD-10-CM | POA: Diagnosis not present

## 2019-11-09 DIAGNOSIS — M4807 Spinal stenosis, lumbosacral region: Secondary | ICD-10-CM | POA: Diagnosis not present

## 2019-11-09 DIAGNOSIS — Z6838 Body mass index (BMI) 38.0-38.9, adult: Secondary | ICD-10-CM | POA: Diagnosis not present

## 2019-11-09 DIAGNOSIS — M5416 Radiculopathy, lumbar region: Secondary | ICD-10-CM | POA: Diagnosis not present

## 2019-11-09 DIAGNOSIS — R030 Elevated blood-pressure reading, without diagnosis of hypertension: Secondary | ICD-10-CM | POA: Diagnosis not present

## 2019-12-05 DIAGNOSIS — M545 Low back pain: Secondary | ICD-10-CM | POA: Diagnosis not present

## 2019-12-05 DIAGNOSIS — M4807 Spinal stenosis, lumbosacral region: Secondary | ICD-10-CM | POA: Diagnosis not present

## 2019-12-11 DIAGNOSIS — R030 Elevated blood-pressure reading, without diagnosis of hypertension: Secondary | ICD-10-CM | POA: Diagnosis not present

## 2019-12-11 DIAGNOSIS — M4807 Spinal stenosis, lumbosacral region: Secondary | ICD-10-CM | POA: Diagnosis not present

## 2019-12-11 DIAGNOSIS — M5416 Radiculopathy, lumbar region: Secondary | ICD-10-CM | POA: Diagnosis not present

## 2020-10-10 DIAGNOSIS — R002 Palpitations: Secondary | ICD-10-CM | POA: Diagnosis not present

## 2020-10-10 DIAGNOSIS — R0789 Other chest pain: Secondary | ICD-10-CM | POA: Diagnosis not present

## 2020-10-10 DIAGNOSIS — D6851 Activated protein C resistance: Secondary | ICD-10-CM | POA: Diagnosis not present

## 2020-11-21 DIAGNOSIS — Z20822 Contact with and (suspected) exposure to covid-19: Secondary | ICD-10-CM | POA: Diagnosis not present

## 2020-11-24 DIAGNOSIS — Z20822 Contact with and (suspected) exposure to covid-19: Secondary | ICD-10-CM | POA: Diagnosis not present

## 2020-11-26 DIAGNOSIS — Z20822 Contact with and (suspected) exposure to covid-19: Secondary | ICD-10-CM | POA: Diagnosis not present

## 2021-02-16 DIAGNOSIS — Z20828 Contact with and (suspected) exposure to other viral communicable diseases: Secondary | ICD-10-CM | POA: Diagnosis not present

## 2021-03-05 DIAGNOSIS — Z20828 Contact with and (suspected) exposure to other viral communicable diseases: Secondary | ICD-10-CM | POA: Diagnosis not present

## 2021-03-16 DIAGNOSIS — Z20828 Contact with and (suspected) exposure to other viral communicable diseases: Secondary | ICD-10-CM | POA: Diagnosis not present

## 2021-03-23 DIAGNOSIS — F411 Generalized anxiety disorder: Secondary | ICD-10-CM | POA: Diagnosis not present

## 2021-03-23 DIAGNOSIS — R002 Palpitations: Secondary | ICD-10-CM | POA: Diagnosis not present

## 2021-03-23 DIAGNOSIS — R232 Flushing: Secondary | ICD-10-CM | POA: Diagnosis not present

## 2021-03-23 DIAGNOSIS — D6851 Activated protein C resistance: Secondary | ICD-10-CM | POA: Diagnosis not present

## 2021-03-23 DIAGNOSIS — N926 Irregular menstruation, unspecified: Secondary | ICD-10-CM | POA: Diagnosis not present

## 2021-03-23 DIAGNOSIS — Z0000 Encounter for general adult medical examination without abnormal findings: Secondary | ICD-10-CM | POA: Diagnosis not present

## 2021-04-28 DIAGNOSIS — J383 Other diseases of vocal cords: Secondary | ICD-10-CM | POA: Diagnosis not present

## 2021-05-21 DIAGNOSIS — J383 Other diseases of vocal cords: Secondary | ICD-10-CM | POA: Diagnosis not present

## 2021-05-21 DIAGNOSIS — R490 Dysphonia: Secondary | ICD-10-CM | POA: Diagnosis not present

## 2021-05-21 DIAGNOSIS — J385 Laryngeal spasm: Secondary | ICD-10-CM | POA: Diagnosis not present

## 2021-05-25 DIAGNOSIS — F411 Generalized anxiety disorder: Secondary | ICD-10-CM | POA: Diagnosis not present

## 2021-09-08 ENCOUNTER — Other Ambulatory Visit: Payer: Self-pay

## 2021-09-09 ENCOUNTER — Ambulatory Visit: Payer: BC Managed Care – PPO | Admitting: Podiatry

## 2021-09-09 ENCOUNTER — Encounter: Payer: Self-pay | Admitting: Podiatry

## 2021-09-09 ENCOUNTER — Other Ambulatory Visit: Payer: Self-pay

## 2021-09-09 DIAGNOSIS — F4320 Adjustment disorder, unspecified: Secondary | ICD-10-CM | POA: Insufficient documentation

## 2021-09-09 DIAGNOSIS — Z0000 Encounter for general adult medical examination without abnormal findings: Secondary | ICD-10-CM | POA: Insufficient documentation

## 2021-09-09 DIAGNOSIS — H8110 Benign paroxysmal vertigo, unspecified ear: Secondary | ICD-10-CM | POA: Insufficient documentation

## 2021-09-09 DIAGNOSIS — R21 Rash and other nonspecific skin eruption: Secondary | ICD-10-CM | POA: Insufficient documentation

## 2021-09-09 DIAGNOSIS — N959 Unspecified menopausal and perimenopausal disorder: Secondary | ICD-10-CM | POA: Insufficient documentation

## 2021-09-09 DIAGNOSIS — F419 Anxiety disorder, unspecified: Secondary | ICD-10-CM | POA: Insufficient documentation

## 2021-09-09 DIAGNOSIS — Q6671 Congenital pes cavus, right foot: Secondary | ICD-10-CM | POA: Diagnosis not present

## 2021-09-09 DIAGNOSIS — N926 Irregular menstruation, unspecified: Secondary | ICD-10-CM | POA: Insufficient documentation

## 2021-09-09 DIAGNOSIS — D6851 Activated protein C resistance: Secondary | ICD-10-CM | POA: Insufficient documentation

## 2021-09-09 DIAGNOSIS — R002 Palpitations: Secondary | ICD-10-CM | POA: Insufficient documentation

## 2021-09-09 DIAGNOSIS — I700 Atherosclerosis of aorta: Secondary | ICD-10-CM | POA: Insufficient documentation

## 2021-09-09 DIAGNOSIS — R490 Dysphonia: Secondary | ICD-10-CM | POA: Insufficient documentation

## 2021-09-09 DIAGNOSIS — R0989 Other specified symptoms and signs involving the circulatory and respiratory systems: Secondary | ICD-10-CM | POA: Insufficient documentation

## 2021-09-09 DIAGNOSIS — Q6672 Congenital pes cavus, left foot: Secondary | ICD-10-CM

## 2021-09-09 DIAGNOSIS — M5416 Radiculopathy, lumbar region: Secondary | ICD-10-CM | POA: Insufficient documentation

## 2021-09-09 DIAGNOSIS — L600 Ingrowing nail: Secondary | ICD-10-CM

## 2021-09-09 DIAGNOSIS — Q667 Congenital pes cavus, unspecified foot: Secondary | ICD-10-CM

## 2021-09-09 DIAGNOSIS — R011 Cardiac murmur, unspecified: Secondary | ICD-10-CM | POA: Insufficient documentation

## 2021-09-09 NOTE — Progress Notes (Signed)
Subjective:  Patient ID: Linda [name], female    DOB: 1965-09-23,  MRN: [id number]  Chief Complaint  Patient presents with   Ingrown Toenail    Right foot 4th ingrown nail     56 y.o. female presents with the above complaint.  Patient presents with right lateral fourth digit ingrown.  She states that she went to a pedicurist or had tight shoes that could have led to starting it.  She would like to have removed.  She has not seen anyone else prior to seeing me.  She has secondary complaint of high arch foot structure and hammertoe contracture associate with it.  She wears more dress shoes.  She would like to discuss options for orthotics.  Her pain scale from ingrown is about 7 out of 10 hurts with ambulation.  She has been soaking and had some drainage.  Is been swollen.  She denies any other acute complaints.   Review of Systems: Negative except as noted in the HPI. Denies N/V/F/Ch.  Past Medical History:  Diagnosis Date   CHICKENPOX, HX OF 06/02/2010   DEPRESSION 06/02/2010   Headache(784.0) 06/02/2010   HEART MURMUR, HX OF 06/02/2010   HYPERTENSION 06/02/2010   UTI'S, HX OF 06/02/2010   VENEREAL WART 06/02/2010    Current Outpatient Medications:    ALPRAZolam (XANAX) 0.25 MG tablet, , Disp: , Rfl:    brompheniramine-pseudoephedrine-DM 30-2-10 MG/5ML syrup, , Disp: , Rfl:    calcium carbonate (OSCAL) 1500 (600 Ca) MG TABS tablet, Take by mouth., Disp: , Rfl:    citalopram (CELEXA) 20 MG tablet, Take 20 mg by mouth daily., Disp: , Rfl:    clonazePAM (KLONOPIN) 0.5 MG tablet, , Disp: , Rfl:    ibuprofen (ADVIL) 200 MG tablet, 2 tablets, Disp: , Rfl:    metoprolol tartrate (LOPRESSOR) 25 MG tablet, , Disp: , Rfl:    Multiple Vitamin (THERA) TABS, Take 1 tablet by mouth daily., Disp: , Rfl:    oseltamivir (TAMIFLU) 75 MG capsule, Take by mouth., Disp: , Rfl:    promethazine (PHENERGAN) 25 MG tablet, Take by mouth., Disp: , Rfl:    Rhubarb (ESTROVEN MENOPAUSE RELIEF) 4 MG TABS, See admin  instructions., Disp: , Rfl:    venlafaxine XR (EFFEXOR-XR) 75 MG 24 hr capsule, , Disp: , Rfl:   Social History   Tobacco Use  Smoking Status Not on file  Smokeless Tobacco Not on file    Not on File Objective:  There were no vitals filed for this visit. There is no height or weight on file to calculate BMI. Constitutional Well developed. Well nourished.  Vascular Dorsalis pedis pulses palpable bilaterally. Posterior tibial pulses palpable bilaterally. Capillary refill normal to all digits.  No cyanosis or clubbing noted. Pedal hair growth normal.  Neurologic Normal speech. Oriented to person, place, and time. Epicritic sensation to light touch grossly present bilaterally.  Dermatologic Painful ingrowing nail at lateral nail borders of the hallux nail right. No other open wounds. No skin lesions.  Orthopedic: Normal joint ROM without pain or crepitus bilaterally. Hammertoe contracture noted semiflexible digit digits 2 through 5 bilaterally.  Mild pain on palpation.  Bunion deformity noted moderate in nature 2 bilaterally.  No pain on palpation No bony tenderness.   Radiographs: None Assessment:   1. Pes cavus   2. Ingrown toenail of right foot    Plan:  Patient was evaluated and treated and all questions answered.  Pes cavus -I explained to patient the etiology of pes cavus and  relationship with hammertoe contracture and various treatment options were discussed.  Given patient foot structure in the setting of hammertoe contracture I believe patient will benefit from custom-made orthotics to help control the hindfoot motion support the arch of the foot and take the stress away from hammertoe contracture.  Patient agrees with the plan like to proceed with orthotics -Patient was casted for orthotics   Ingrown Nail, right -Patient elects to proceed with minor surgery to remove ingrown toenail removal today. Consent reviewed and signed by patient. -Ingrown nail excised. See  procedure note. -Educated on post-procedure care including soaking. Written instructions provided and reviewed. -Patient to follow up in 2 weeks for nail check.  Procedure: Excision of Ingrown Toenail Location: Right 1st toe lateral nail borders. Anesthesia: Lidocaine 1% plain; 1.5 mL and Marcaine 0.5% plain; 1.5 mL, digital block. Skin Prep: Betadine. Dressing: Silvadene; telfa; dry, sterile, compression dressing. Technique: Following skin prep, the toe was exsanguinated and a tourniquet was secured at the base of the toe. The affected nail border was freed, split with a nail splitter, and excised. Chemical matrixectomy was then performed with phenol and irrigated out with alcohol. The tourniquet was then removed and sterile dressing applied. Disposition: Patient tolerated procedure well. Patient to return in 2 weeks for follow-up.   No follow-ups on file.

## 2021-10-09 ENCOUNTER — Ambulatory Visit (INDEPENDENT_AMBULATORY_CARE_PROVIDER_SITE_OTHER): Payer: BC Managed Care – PPO

## 2021-10-09 ENCOUNTER — Other Ambulatory Visit: Payer: Self-pay

## 2021-10-09 DIAGNOSIS — Q667 Congenital pes cavus, unspecified foot: Secondary | ICD-10-CM

## 2021-10-09 NOTE — Progress Notes (Signed)
SITUATION: Reason for Visit: Fitting and Delivery of Custom Fabricated Foot Orthoses Patient Report: Patient reports comfort and is satisfied with device.  OBJECTIVE DATA: Patient History / Diagnosis:     ICD-10-CM   1. Pes cavus  Q66.70       Provided Device:  Custom Functional Foot Orthotics     [facility]: [phone]  GOAL OF ORTHOSIS - Improve gait - Decrease energy expenditure - Improve Balance - Provide Triplanar stability of foot complex - Facilitate motion  ACTIONS PERFORMED Patient was fit with foot orthotics trimmed to shoe last. Patient tolerated fittign procedure.   Patient was provided with verbal and written instruction and demonstration regarding donning, doffing, wear, care, proper fit, function, purpose, cleaning, and use of the orthosis and in all related precautions and risks and benefits regarding the orthosis.  Patient was also provided with verbal instruction regarding how to report any failures or malfunctions of the orthosis and necessary follow up care. Patient was also instructed to contact our office regarding any change in status that may affect the function of the orthosis.  Patient demonstrated independence with proper donning, doffing, and fit and verbalized understanding of all instructions.  PLAN: Patient is to follow up in one week or as necessary (PRN). All questions were answered and concerns addressed. Plan of care was discussed with and agreed upon by the patient.

## 2021-10-19 DIAGNOSIS — Z201 Contact with and (suspected) exposure to tuberculosis: Secondary | ICD-10-CM | POA: Diagnosis not present

## 2021-10-20 DIAGNOSIS — D2371 Other benign neoplasm of skin of right lower limb, including hip: Secondary | ICD-10-CM | POA: Diagnosis not present

## 2021-10-20 DIAGNOSIS — Z789 Other specified health status: Secondary | ICD-10-CM | POA: Diagnosis not present

## 2021-10-20 DIAGNOSIS — L820 Inflamed seborrheic keratosis: Secondary | ICD-10-CM | POA: Diagnosis not present

## 2021-10-20 DIAGNOSIS — L538 Other specified erythematous conditions: Secondary | ICD-10-CM | POA: Diagnosis not present
